# Patient Record
Sex: Female | Born: 1965 | Race: White | Hispanic: No | Marital: Married | Smoking: Never smoker
Health system: Southern US, Community
[De-identification: ages and names within clinical notes are randomized; demographics above are authoritative.]

---

## 2014-08-03 ENCOUNTER — Other Ambulatory Visit: Payer: Self-pay | Admitting: Unknown Physician Specialty

## 2014-08-03 DIAGNOSIS — R928 Other abnormal and inconclusive findings on diagnostic imaging of breast: Secondary | ICD-10-CM

## 2014-08-04 ENCOUNTER — Ambulatory Visit
Admission: RE | Admit: 2014-08-04 | Discharge: 2014-08-04 | Disposition: A | Discharge status: Home / Self Care | Payer: BLUE CROSS/BLUE SHIELD | Source: Ambulatory Visit | Attending: Unknown Physician Specialty | Admitting: Unknown Physician Specialty

## 2014-08-04 DIAGNOSIS — R928 Other abnormal and inconclusive findings on diagnostic imaging of breast: Secondary | ICD-10-CM

## 2014-08-08 ENCOUNTER — Other Ambulatory Visit: Payer: Self-pay

## 2015-07-17 ENCOUNTER — Other Ambulatory Visit: Payer: Self-pay

## 2015-07-17 DIAGNOSIS — Z1231 Encounter for screening mammogram for malignant neoplasm of breast: Secondary | ICD-10-CM

## 2015-08-27 ENCOUNTER — Ambulatory Visit
Admission: RE | Admit: 2015-08-27 | Discharge: 2015-08-27 | Disposition: A | Discharge status: Home / Self Care | Payer: BLUE CROSS/BLUE SHIELD | Source: Ambulatory Visit

## 2015-08-27 DIAGNOSIS — Z1231 Encounter for screening mammogram for malignant neoplasm of breast: Secondary | ICD-10-CM

## 2016-07-21 ENCOUNTER — Other Ambulatory Visit: Payer: Self-pay | Admitting: Unknown Physician Specialty

## 2016-07-21 DIAGNOSIS — Z1231 Encounter for screening mammogram for malignant neoplasm of breast: Secondary | ICD-10-CM

## 2016-09-01 ENCOUNTER — Ambulatory Visit: Payer: BLUE CROSS/BLUE SHIELD

## 2016-09-09 ENCOUNTER — Ambulatory Visit
Admission: RE | Admit: 2016-09-09 | Discharge: 2016-09-09 | Disposition: A | Discharge status: Home / Self Care | Payer: Commercial Managed Care - PPO | Source: Ambulatory Visit | Attending: Unknown Physician Specialty | Admitting: Unknown Physician Specialty

## 2016-09-09 DIAGNOSIS — Z1231 Encounter for screening mammogram for malignant neoplasm of breast: Secondary | ICD-10-CM

## 2017-08-24 ENCOUNTER — Other Ambulatory Visit: Payer: Self-pay | Admitting: Unknown Physician Specialty

## 2017-08-24 DIAGNOSIS — Z1231 Encounter for screening mammogram for malignant neoplasm of breast: Secondary | ICD-10-CM

## 2017-09-25 ENCOUNTER — Ambulatory Visit
Admission: RE | Admit: 2017-09-25 | Discharge: 2017-09-25 | Disposition: A | Discharge status: Home / Self Care | Payer: Commercial Managed Care - PPO | Source: Ambulatory Visit | Attending: Unknown Physician Specialty | Admitting: Unknown Physician Specialty

## 2017-09-25 DIAGNOSIS — Z1231 Encounter for screening mammogram for malignant neoplasm of breast: Secondary | ICD-10-CM

## 2017-09-28 ENCOUNTER — Other Ambulatory Visit: Payer: Self-pay | Admitting: Unknown Physician Specialty

## 2017-09-28 DIAGNOSIS — R928 Other abnormal and inconclusive findings on diagnostic imaging of breast: Secondary | ICD-10-CM

## 2017-09-30 ENCOUNTER — Other Ambulatory Visit: Payer: Commercial Managed Care - PPO

## 2017-09-30 ENCOUNTER — Ambulatory Visit
Admission: RE | Admit: 2017-09-30 | Discharge: 2017-09-30 | Disposition: A | Discharge status: Home / Self Care | Payer: Commercial Managed Care - PPO | Source: Ambulatory Visit | Attending: Unknown Physician Specialty | Admitting: Unknown Physician Specialty

## 2017-09-30 ENCOUNTER — Other Ambulatory Visit: Payer: Self-pay | Admitting: Unknown Physician Specialty

## 2017-09-30 DIAGNOSIS — R928 Other abnormal and inconclusive findings on diagnostic imaging of breast: Secondary | ICD-10-CM

## 2017-09-30 DIAGNOSIS — N6320 Unspecified lump in the left breast, unspecified quadrant: Secondary | ICD-10-CM

## 2018-04-05 ENCOUNTER — Ambulatory Visit
Admission: RE | Admit: 2018-04-05 | Discharge: 2018-04-05 | Disposition: A | Discharge status: Home / Self Care | Payer: Commercial Managed Care - PPO | Source: Ambulatory Visit | Attending: Unknown Physician Specialty | Admitting: Unknown Physician Specialty

## 2018-04-05 ENCOUNTER — Other Ambulatory Visit: Payer: Self-pay | Admitting: Unknown Physician Specialty

## 2018-04-05 DIAGNOSIS — N6320 Unspecified lump in the left breast, unspecified quadrant: Secondary | ICD-10-CM

## 2018-04-05 DIAGNOSIS — R928 Other abnormal and inconclusive findings on diagnostic imaging of breast: Secondary | ICD-10-CM

## 2018-10-05 ENCOUNTER — Ambulatory Visit
Admission: RE | Admit: 2018-10-05 | Discharge: 2018-10-05 | Disposition: A | Discharge status: Home / Self Care | Payer: Commercial Managed Care - PPO | Source: Ambulatory Visit | Attending: Unknown Physician Specialty | Admitting: Unknown Physician Specialty

## 2018-10-05 ENCOUNTER — Other Ambulatory Visit: Payer: Self-pay

## 2018-10-05 DIAGNOSIS — N6320 Unspecified lump in the left breast, unspecified quadrant: Secondary | ICD-10-CM

## 2019-09-01 IMAGING — MG DIGITAL SCREENING BILATERAL MAMMOGRAM WITH TOMO AND CAD
8 series · 9 of 24 positions shown · non-contrast
Comparison: Previous exam(s).

CLINICAL DATA: Screening.

EXAM:
DIGITAL SCREENING BILATERAL MAMMOGRAM WITH TOMO AND CAD

[R MLO synth-2D]
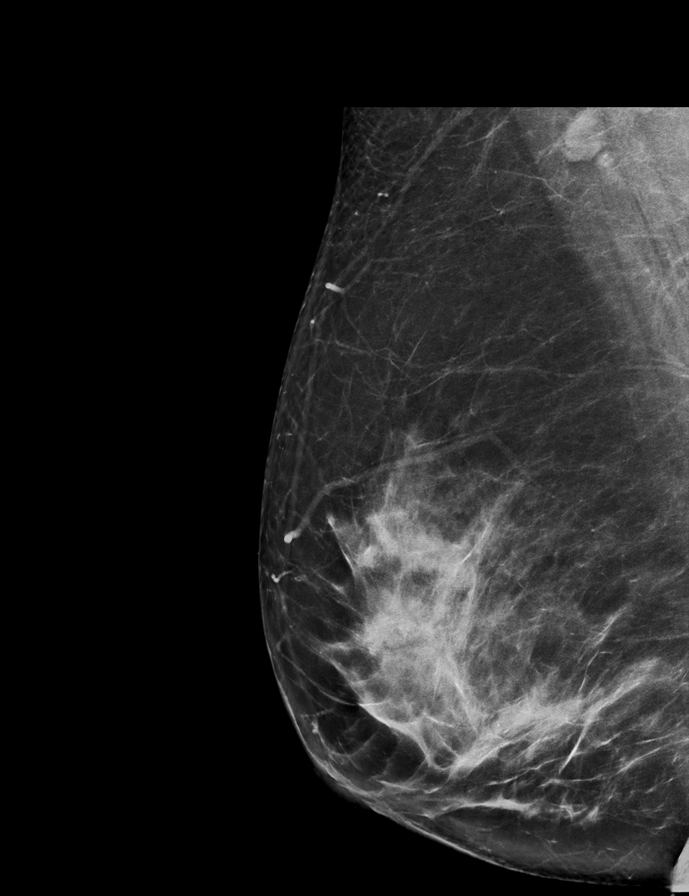

[L MLO synth-2D]
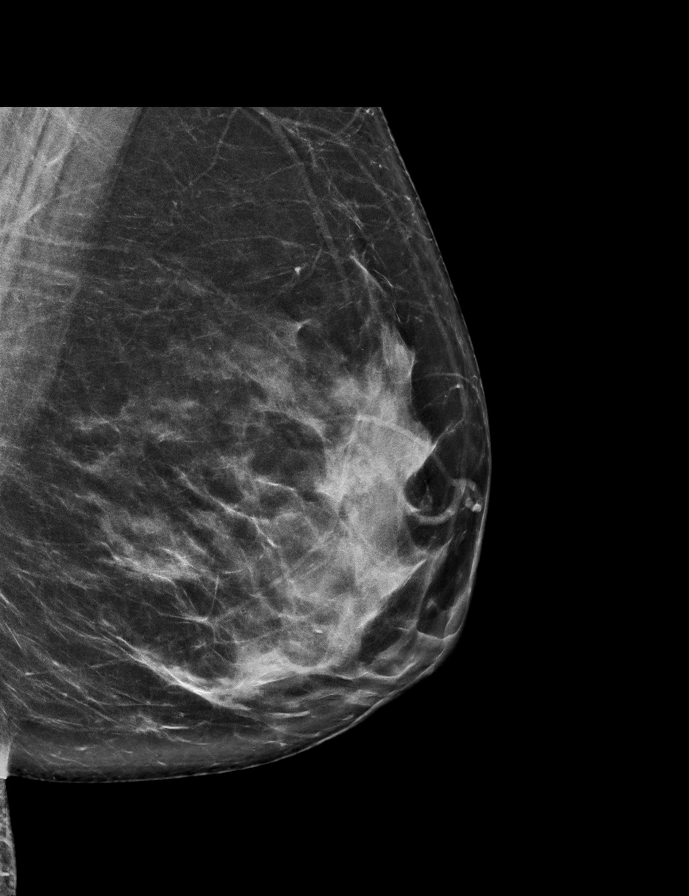

[L CC synth-2D]
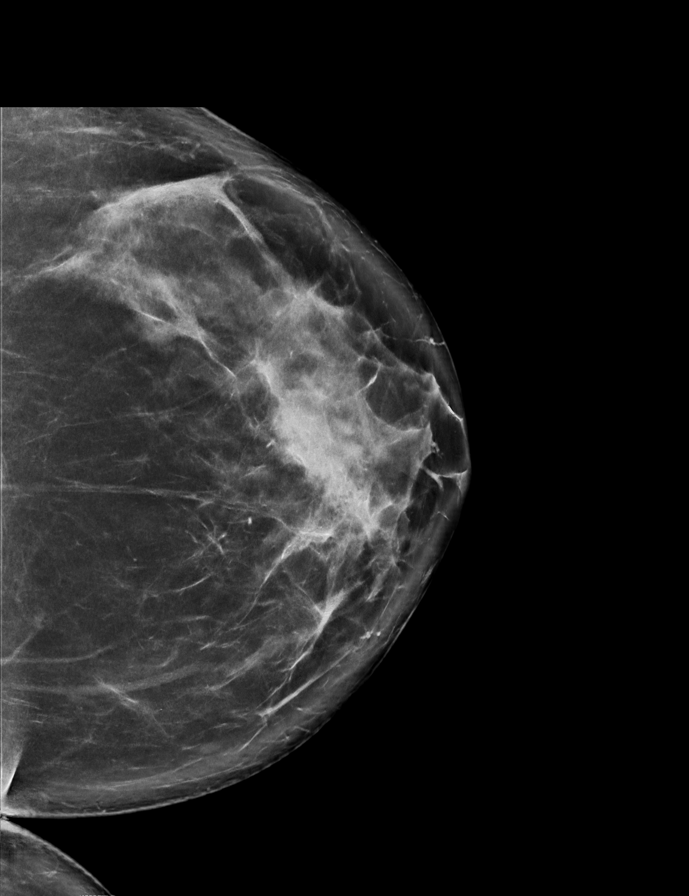

[R CC synth-2D]
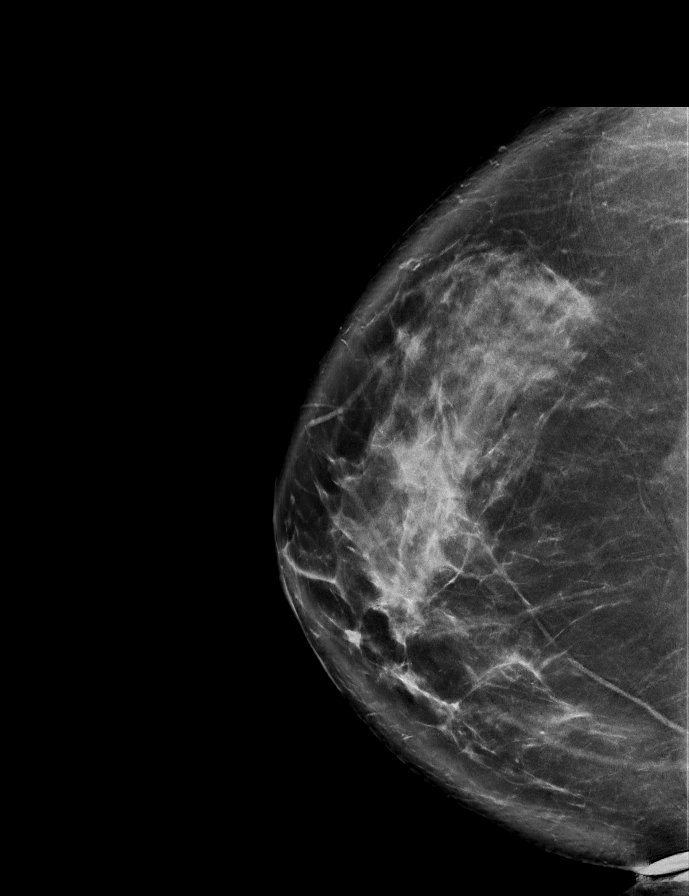

[R MLO tomo · 2 of 85 frames shown]
[frame 28/85]
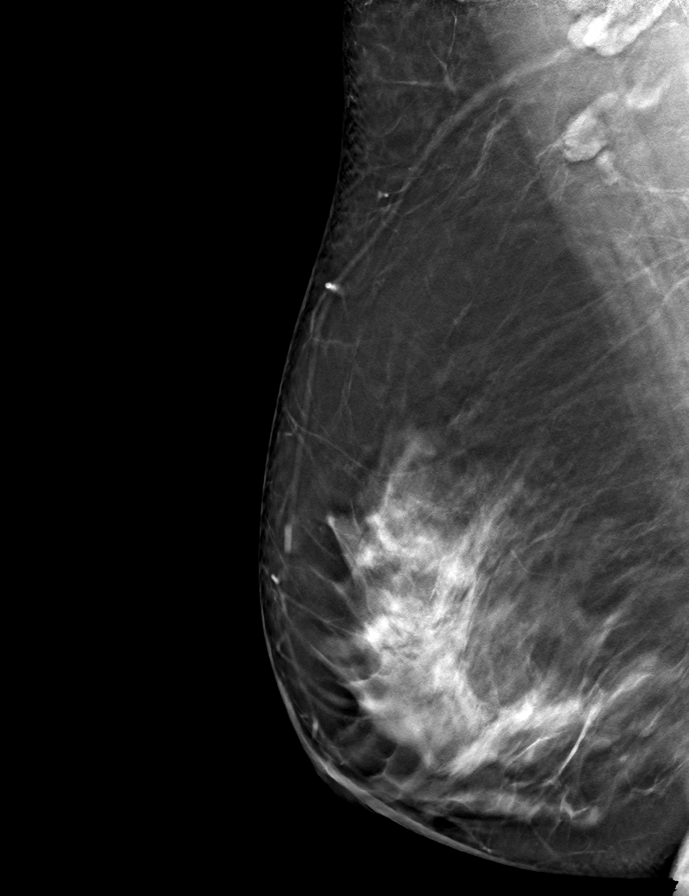
[frame 43/85]
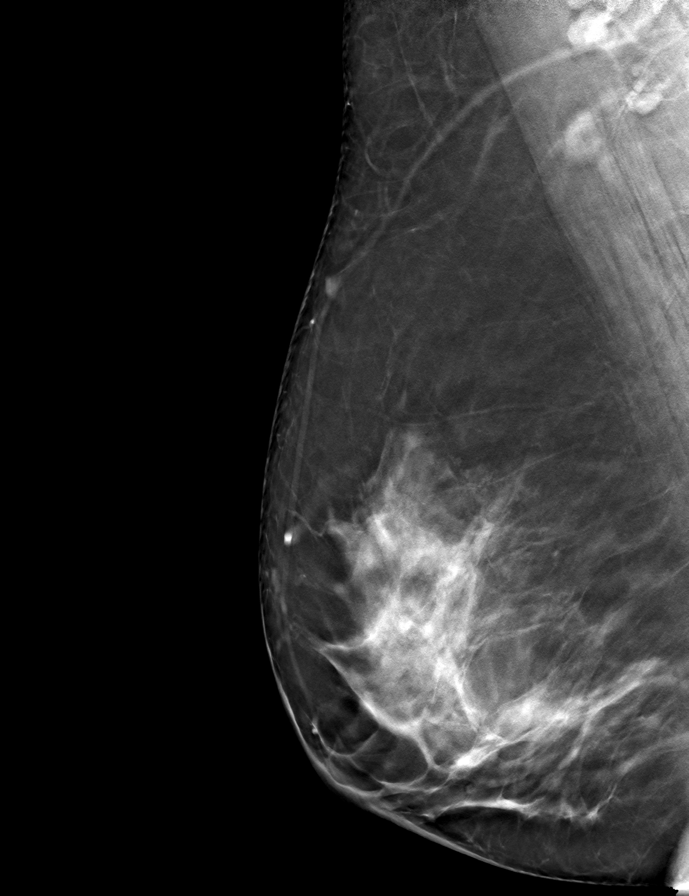

[R CC tomo · tomo slice 47/93.0]
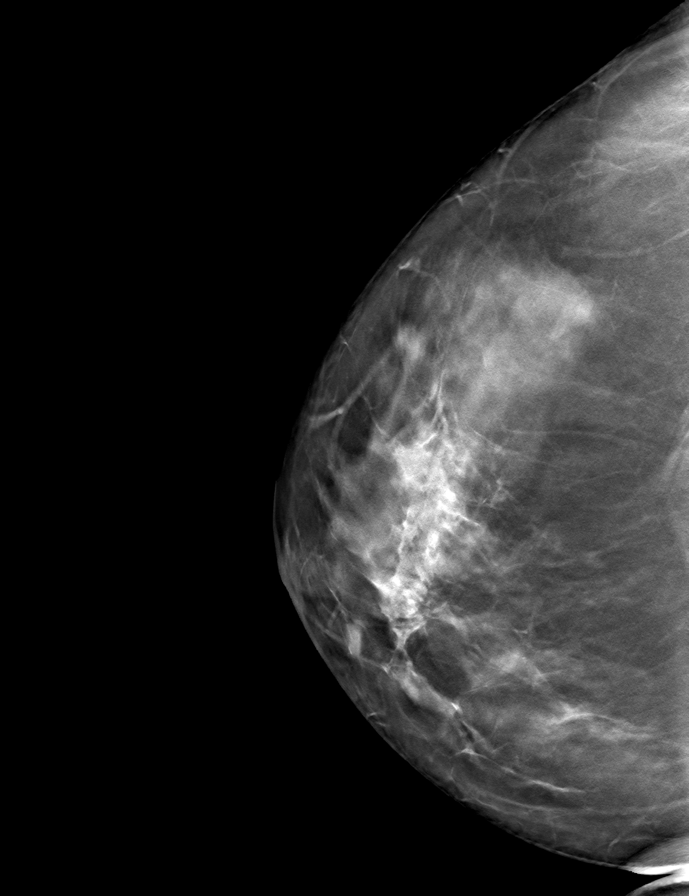

[L MLO tomo · tomo slice 42/83.0]
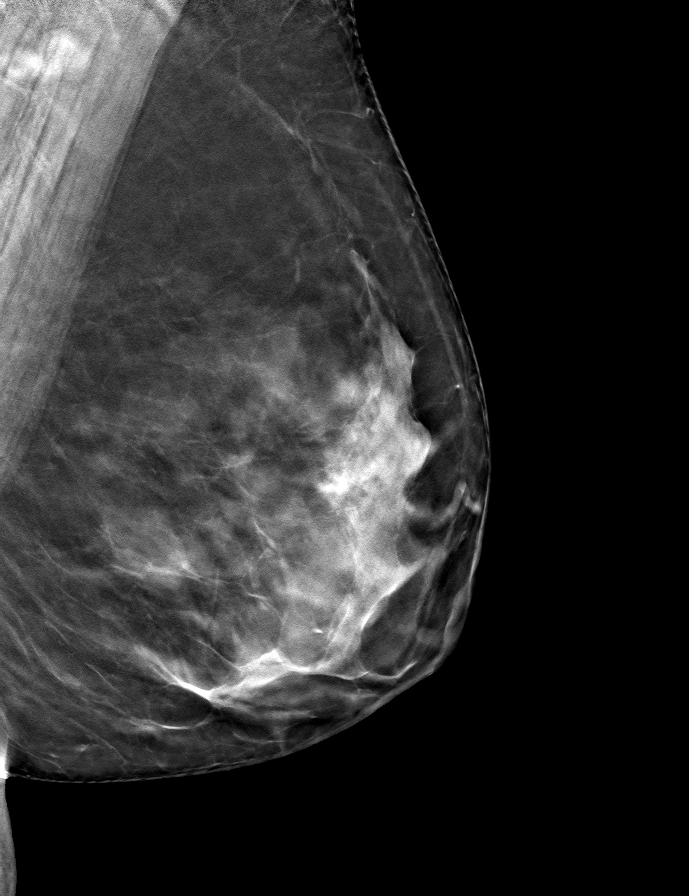

[L CC tomo · tomo slice 46/91.0]
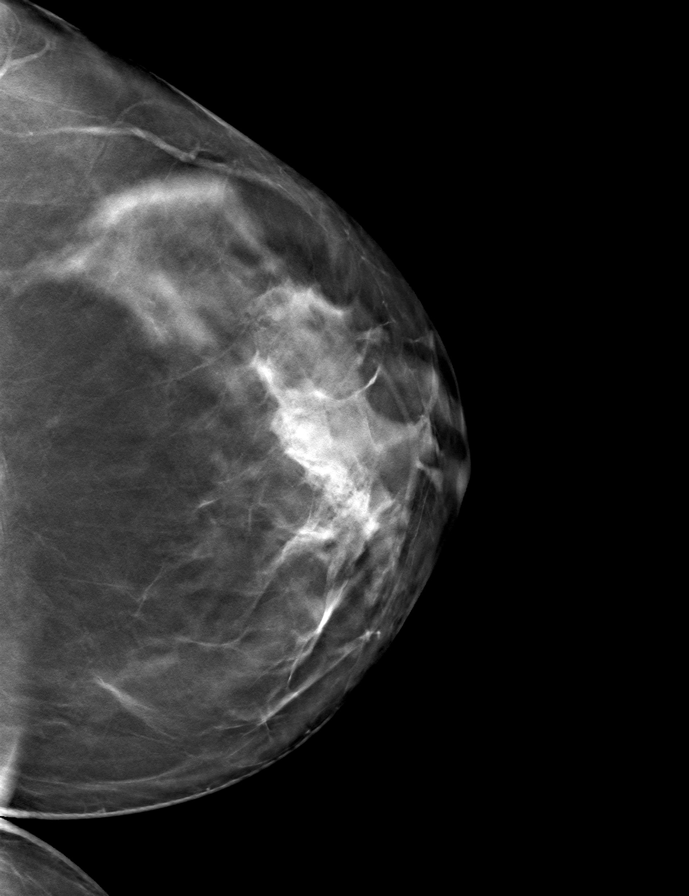

[9 of 24 positions shown; findings below may reference images not displayed]

ACR Breast Density Category c: The breast tissue is heterogeneously
dense, which may obscure small masses.
FINDINGS: In the left breast, a possible mass warrants further evaluation. In
the right breast, no findings suspicious for malignancy.

Images were processed with CAD.
IMPRESSION: Further evaluation is suggested for possible mass in the left
breast.

RECOMMENDATION:
Diagnostic mammogram and possibly ultrasound of the left breast.
(Code:IJ-[id number])

The patient will be contacted regarding the findings, and additional
imaging will be scheduled.

BI-RADS CATEGORY  0: Incomplete. Need additional imaging evaluation
and/or prior mammograms for comparison.

## 2019-09-14 ENCOUNTER — Other Ambulatory Visit: Payer: Self-pay | Admitting: Nurse Practitioner

## 2019-09-14 DIAGNOSIS — N6320 Unspecified lump in the left breast, unspecified quadrant: Secondary | ICD-10-CM

## 2019-10-18 ENCOUNTER — Other Ambulatory Visit: Payer: Self-pay

## 2019-10-18 ENCOUNTER — Ambulatory Visit
Admission: RE | Admit: 2019-10-18 | Discharge: 2019-10-18 | Disposition: A | Discharge status: Home / Self Care | Payer: Commercial Managed Care - PPO | Source: Ambulatory Visit | Attending: Nurse Practitioner | Admitting: Nurse Practitioner

## 2019-10-18 DIAGNOSIS — N6320 Unspecified lump in the left breast, unspecified quadrant: Secondary | ICD-10-CM

## 2020-09-27 ENCOUNTER — Other Ambulatory Visit: Payer: Self-pay | Admitting: Nurse Practitioner

## 2020-09-27 DIAGNOSIS — Z1231 Encounter for screening mammogram for malignant neoplasm of breast: Secondary | ICD-10-CM

## 2020-11-20 ENCOUNTER — Other Ambulatory Visit: Payer: Self-pay

## 2020-11-20 ENCOUNTER — Ambulatory Visit
Admission: RE | Admit: 2020-11-20 | Discharge: 2020-11-20 | Disposition: A | Discharge status: Home / Self Care | Payer: Commercial Managed Care - PPO | Source: Ambulatory Visit | Attending: Nurse Practitioner | Admitting: Nurse Practitioner

## 2020-11-20 DIAGNOSIS — Z1231 Encounter for screening mammogram for malignant neoplasm of breast: Secondary | ICD-10-CM

## 2021-10-09 ENCOUNTER — Other Ambulatory Visit: Payer: Self-pay | Admitting: Nurse Practitioner

## 2021-10-09 DIAGNOSIS — Z1231 Encounter for screening mammogram for malignant neoplasm of breast: Secondary | ICD-10-CM

## 2021-11-25 ENCOUNTER — Ambulatory Visit
Admission: RE | Admit: 2021-11-25 | Discharge: 2021-11-25 | Disposition: A | Discharge status: Home / Self Care | Payer: Commercial Managed Care - PPO | Source: Ambulatory Visit | Attending: Nurse Practitioner | Admitting: Nurse Practitioner

## 2021-11-25 DIAGNOSIS — Z1231 Encounter for screening mammogram for malignant neoplasm of breast: Secondary | ICD-10-CM

## 2021-11-27 ENCOUNTER — Other Ambulatory Visit: Payer: Self-pay | Admitting: Nurse Practitioner

## 2021-11-27 DIAGNOSIS — R928 Other abnormal and inconclusive findings on diagnostic imaging of breast: Secondary | ICD-10-CM

## 2021-12-03 ENCOUNTER — Other Ambulatory Visit: Payer: Self-pay | Admitting: Nurse Practitioner

## 2021-12-03 ENCOUNTER — Ambulatory Visit
Admission: RE | Admit: 2021-12-03 | Discharge: 2021-12-03 | Disposition: A | Discharge status: Home / Self Care | Payer: Commercial Managed Care - PPO | Source: Ambulatory Visit | Attending: Nurse Practitioner | Admitting: Nurse Practitioner

## 2021-12-03 DIAGNOSIS — R928 Other abnormal and inconclusive findings on diagnostic imaging of breast: Secondary | ICD-10-CM

## 2021-12-03 DIAGNOSIS — N6320 Unspecified lump in the left breast, unspecified quadrant: Secondary | ICD-10-CM

## 2022-03-27 ENCOUNTER — Ambulatory Visit
Admission: EM | Admit: 2022-03-27 | Discharge: 2022-03-27 | Disposition: A | Discharge status: Home / Self Care | Payer: Commercial Managed Care - PPO | Attending: Nurse Practitioner | Admitting: Nurse Practitioner

## 2022-03-27 ENCOUNTER — Other Ambulatory Visit: Payer: Self-pay

## 2022-03-27 ENCOUNTER — Encounter: Payer: Self-pay | Admitting: Emergency Medicine

## 2022-03-27 DIAGNOSIS — J069 Acute upper respiratory infection, unspecified: Secondary | ICD-10-CM

## 2022-03-27 DIAGNOSIS — Z1152 Encounter for screening for COVID-19: Secondary | ICD-10-CM

## 2022-03-27 MED ORDER — BENZONATATE 100 MG PO CAPS: 100.0000 mg | ORAL_CAPSULE | Freq: Three times a day (TID) | ORAL | 0 refills | Status: AC | PRN

## 2022-03-27 NOTE — ED Provider Notes (Signed)
[facility]    CSN: [id number] Arrival date & time: 03/27/22  1126      History   Chief Complaint Chief Complaint  Patient presents with   Chills    Sinus drainage and congestion - Entered by patient    HPI Kimberly Smith is a 57 y.o. female.   Patient presents today for 2-day history of low-grade fevers at home, body aches, and chills.  Also endorses dry/tickly cough, nasal congestion and runny nose, postnasal drainage, sneezing, sore throat, sinus pressure, headache, and watery eyes.  Also has a decreased appetite and fatigue.  Reports that the sore throat, sneezing, and watery eyes are improved.  No shortness of breath, chest pain, chest congestion, ear pain, abdominal pain, vomiting, diarrhea, loss of taste or smell, or new rash.  No known sick contacts.  Has been taking over-the-counter cough and cold medications that helped until they wear out.  Patient denies any significant medical history.     History reviewed. No pertinent past medical history.  There are no problems to display for this patient.   History reviewed. No pertinent surgical history.  OB History   No obstetric history on file.      Home Medications    Prior to Admission medications   Medication Sig Start Date End Date Taking? Authorizing Provider  benzonatate (TESSALON) 100 MG capsule Take 1-2 capsules (100-200 mg total) by mouth 3 (three) times daily as needed for cough. Do not take with alcohol or while driving or operating heavy machinery.  May cause drowsiness. 03/27/22  Yes Eulogio Bear, NP    Family History Family History  Problem Relation Age of Onset   Breast cancer Neg Hx     Social History Social History   Tobacco Use   Smoking status: Never   Smokeless tobacco: Never  Substance Use Topics   Alcohol use: Yes    Comment: occ     Allergies   Sulfa antibiotics   Review of Systems Review of Systems Per HPI  Physical Exam Triage Vital Signs ED Triage  Vitals  Enc Vitals Group     BP 03/27/22 1132 137/83     Pulse Rate 03/27/22 1132 86     Resp 03/27/22 1132 20     Temp 03/27/22 1132 97.9 F (36.6 C)     Temp Source 03/27/22 1132 Oral     SpO2 03/27/22 1132 97 %     Weight --      Height --      Head Circumference --      Peak Flow --      Pain Score 03/27/22 1130 5     Pain Loc --      Pain Edu? --      Excl. in [place]? --    No data found.  Updated Vital Signs BP 137/83 (BP Location: Right Arm)   Pulse 86   Temp 97.9 F (36.6 C) (Oral)   Resp 20   SpO2 97%   Visual Acuity Right Eye Distance:   Left Eye Distance:   Bilateral Distance:    Right Eye Near:   Left Eye Near:    Bilateral Near:     Physical Exam Vitals and nursing note reviewed.  Constitutional:      General: She is not in acute distress.    Appearance: Normal appearance. She is not ill-appearing or toxic-appearing.  HENT:     Head: Normocephalic and atraumatic.     Right Ear: Ear  canal and external ear normal. A middle ear effusion is present.     Left Ear: Tympanic membrane, ear canal and external ear normal.  No middle ear effusion. There is impacted cerumen.     Nose: Rhinorrhea present. No congestion.     Right Sinus: No maxillary sinus tenderness or frontal sinus tenderness.     Left Sinus: No maxillary sinus tenderness or frontal sinus tenderness.     Mouth/Throat:     Mouth: Mucous membranes are moist.     Pharynx: Oropharynx is clear. Posterior oropharyngeal erythema present. No oropharyngeal exudate.     Comments: Postnasal drainage Eyes:     General: No scleral icterus.    Extraocular Movements: Extraocular movements intact.  Cardiovascular:     Rate and Rhythm: Normal rate and regular rhythm.  Pulmonary:     Effort: Pulmonary effort is normal. No respiratory distress.     Breath sounds: Normal breath sounds. No wheezing, rhonchi or rales.  Abdominal:     General: Abdomen is flat. Bowel sounds are normal. There is no distension.      Palpations: Abdomen is soft.     Tenderness: There is no abdominal tenderness.  Musculoskeletal:     Cervical back: Normal range of motion and neck supple.  Lymphadenopathy:     Cervical: No cervical adenopathy.  Skin:    General: Skin is warm and dry.     Coloration: Skin is not jaundiced or pale.     Findings: No erythema or rash.  Neurological:     Mental Status: She is alert and oriented to person, place, and time.  Psychiatric:        Behavior: Behavior is cooperative.      UC Treatments / Results  Labs (all labs ordered are listed, but only abnormal results are displayed) Labs Reviewed  SARS CORONAVIRUS 2 (TAT 6-24 HRS)    EKG   Radiology No results found.  Procedures Procedures (including critical care time)  Medications Ordered in UC Medications - No data to display  Initial Impression / Assessment and Plan / UC Course  I have reviewed the triage vital signs and the nursing notes.  Pertinent labs & imaging results that were available during my care of the patient were reviewed by me and considered in my medical decision making (see chart for details).   Patient is well-appearing, normotensive, afebrile, not tachycardic, not tachypneic, oxygenating well on room air.    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  All questions answered to their satisfaction.  The patient is in agreement to this plan.    Final Clinical Impressions(s) / UC Diagnoses   Final diagnoses:  Viral URI with cough  Encounter for screening for COVID-19     Discharge Instructions      It sounds like you have a viral sinus infection.  Symptoms should improve over the next week to 10 days.  If you develop chest pain  or shortness of breath, go to the emergency room.  We have tested you today for COVID-19.  You will see the results in Mychart and we will call you with positive results.  Please stay home and isolate until you are aware of the results.    Some things that can make  you feel better are: - Increased rest - Increasing fluid with water/sugar free electrolytes - Acetaminophen and ibuprofen as needed for fever/pain - Salt water gargling, chloraseptic spray and throat lozenges for sore throat - OTC guaifenesin (Mucinex) 600 mg twice daily for congestion - Saline sinus flushes or a neti pot for congestion - Humidifying the air -Tessalon Perles every 8 hours as needed for dry cough     ED Prescriptions     Medication Sig Dispense Auth. Provider   benzonatate (TESSALON) 100 MG capsule Take 1-2 capsules (100-200 mg total) by mouth 3 (three) times daily as needed for cough. Do not take with alcohol or while driving or operating heavy machinery.  May cause drowsiness. 21 capsule Valentino Nose, NP      PDMP not reviewed this encounter.   Valentino Nose, NP 03/27/22 1245

## 2022-03-27 NOTE — ED Triage Notes (Signed)
Pt reports runny nose, generalized body aches, nasal congestion, fever since Tuesday. Has tried otc medication with minimal change in symptoms.

## 2022-03-27 NOTE — Discharge Instructions (Addendum)
It sounds like you have a viral sinus infection.  Symptoms should improve over the next week to 10 days.  If you develop chest pain or shortness of breath, go to the emergency room.  We have tested you today for COVID-19.  You will see the results in Mychart and we will call you with positive results.  Please stay home and isolate until you are aware of the results.    Some things that can make you feel better are: - Increased rest - Increasing fluid with water/sugar free electrolytes - Acetaminophen and ibuprofen as needed for fever/pain - Salt water gargling, chloraseptic spray and throat lozenges for sore throat - OTC guaifenesin (Mucinex) 600 mg twice daily for congestion - Saline sinus flushes or a neti pot for congestion - Humidifying the air -Tessalon Perles every 8 hours as needed for dry cough

## 2022-03-28 LAB — SARS CORONAVIRUS 2 (TAT 6-24 HRS): SARS Coronavirus 2: POSITIVE — AB

## 2022-05-29 ENCOUNTER — Encounter: Payer: Self-pay | Admitting: Nurse Practitioner

## 2022-06-04 ENCOUNTER — Ambulatory Visit
Admission: RE | Admit: 2022-06-04 | Discharge: 2022-06-04 | Disposition: A | Discharge status: Home / Self Care | Payer: Commercial Managed Care - PPO | Source: Ambulatory Visit | Attending: Nurse Practitioner | Admitting: Nurse Practitioner

## 2022-06-04 DIAGNOSIS — N6320 Unspecified lump in the left breast, unspecified quadrant: Secondary | ICD-10-CM

## 2022-11-12 ENCOUNTER — Other Ambulatory Visit: Payer: Self-pay | Admitting: Nurse Practitioner

## 2022-11-12 DIAGNOSIS — Z0000 Encounter for general adult medical examination without abnormal findings: Secondary | ICD-10-CM

## 2022-12-10 ENCOUNTER — Ambulatory Visit
Admission: RE | Admit: 2022-12-10 | Discharge: 2022-12-10 | Disposition: A | Discharge status: Home / Self Care | Payer: Commercial Managed Care - PPO | Source: Ambulatory Visit | Attending: Nurse Practitioner | Admitting: Nurse Practitioner

## 2022-12-10 DIAGNOSIS — Z0000 Encounter for general adult medical examination without abnormal findings: Secondary | ICD-10-CM

## 2022-12-11 ENCOUNTER — Ambulatory Visit: Payer: Commercial Managed Care - PPO

## 2023-08-17 ENCOUNTER — Other Ambulatory Visit (HOSPITAL_COMMUNITY): Payer: Self-pay

## 2023-11-26 ENCOUNTER — Other Ambulatory Visit: Payer: Self-pay | Admitting: Nurse Practitioner

## 2023-11-26 DIAGNOSIS — Z0000 Encounter for general adult medical examination without abnormal findings: Secondary | ICD-10-CM

## 2023-12-29 ENCOUNTER — Ambulatory Visit
Admission: RE | Admit: 2023-12-29 | Discharge: 2023-12-29 | Disposition: A | Discharge status: Home / Self Care | Source: Ambulatory Visit | Attending: Nurse Practitioner | Admitting: Nurse Practitioner

## 2023-12-29 DIAGNOSIS — Z0000 Encounter for general adult medical examination without abnormal findings: Secondary | ICD-10-CM
